# Patient Record
Sex: Female | Born: 1975 | Race: Asian | Hispanic: No | Marital: Single | Smoking: Never smoker
Health system: Southern US, Community
[De-identification: ages and names within clinical notes are randomized; demographics above are authoritative.]

---

## 2003-11-01 ENCOUNTER — Other Ambulatory Visit: Admission: RE | Admit: 2003-11-01 | Discharge: 2003-11-01 | Payer: Self-pay | Admitting: Obstetrics and Gynecology

## 2004-05-25 ENCOUNTER — Inpatient Hospital Stay (HOSPITAL_COMMUNITY): Admission: AD | Admit: 2004-05-25 | Discharge: 2004-05-27 | Payer: Self-pay | Admitting: Obstetrics and Gynecology

## 2004-05-30 ENCOUNTER — Encounter: Admission: RE | Admit: 2004-05-30 | Discharge: 2004-06-29 | Payer: Self-pay | Admitting: Obstetrics and Gynecology

## 2007-04-03 ENCOUNTER — Other Ambulatory Visit: Admission: RE | Admit: 2007-04-03 | Discharge: 2007-04-03 | Payer: Self-pay | Admitting: *Deleted

## 2009-01-08 ENCOUNTER — Inpatient Hospital Stay (HOSPITAL_COMMUNITY): Admission: AD | Admit: 2009-01-08 | Discharge: 2009-01-10 | Payer: Self-pay | Admitting: Obstetrics and Gynecology

## 2011-04-02 LAB — CBC
HCT: 38.4 % (ref 36.0–46.0)
MCHC: 33.4 g/dL (ref 30.0–36.0)
MCV: 82.8 fL (ref 78.0–100.0)
Platelets: 125 10*3/uL — ABNORMAL LOW (ref 150–400)
RBC: 3.91 MIL/uL (ref 3.87–5.11)
RBC: 4.64 MIL/uL (ref 3.87–5.11)
RDW: 16.2 % — ABNORMAL HIGH (ref 11.5–15.5)
WBC: 15.5 10*3/uL — ABNORMAL HIGH (ref 4.0–10.5)

## 2011-04-02 LAB — URINALYSIS, DIPSTICK ONLY
Bilirubin Urine: NEGATIVE
Nitrite: NEGATIVE
Protein, ur: NEGATIVE mg/dL
Specific Gravity, Urine: 1.015 (ref 1.005–1.030)
Urobilinogen, UA: 0.2 mg/dL (ref 0.0–1.0)

## 2011-04-02 LAB — COMPREHENSIVE METABOLIC PANEL
Albumin: 2.7 g/dL — ABNORMAL LOW (ref 3.5–5.2)
Alkaline Phosphatase: 189 U/L — ABNORMAL HIGH (ref 39–117)
BUN: 5 mg/dL — ABNORMAL LOW (ref 6–23)
Chloride: 107 mEq/L (ref 96–112)
Glucose, Bld: 100 mg/dL — ABNORMAL HIGH (ref 70–99)
Potassium: 4.2 mEq/L (ref 3.5–5.1)
Total Bilirubin: 0.2 mg/dL — ABNORMAL LOW (ref 0.3–1.2)

## 2011-04-02 LAB — RPR: RPR Ser Ql: NONREACTIVE

## 2011-05-01 NOTE — Discharge Summary (Signed)
Love, [name]                 ACCOUNT NO.:  [id number]   MEDICAL RECORD NO.:  [id number]          PATIENT TYPE:  INP   LOCATION:  9128                          FACILITY:  [facility]   PHYSICIAN:  Sophia Monday, MD        DATE OF BIRTH:  May 15, 1976   DATE OF ADMISSION:  01/08/2009  DATE OF DISCHARGE:  01/10/2009                               DISCHARGE SUMMARY   ADMITTING DIAGNOSIS:  Intrauterine pregnancy at term and active labor.   DISCHARGE DIAGNOSIS:  Intrauterine pregnancy at term and active labor,  delivered via spontaneous vaginal delivery.   HISTORY OF PRESENT ILLNESS:  A 35 year old G2, P1-0-0-1 at 23 plus weeks  with contractions that have been increasing in intensity and frequency  at approximately 5 a.m.  Good fetal movement, no loss of fluid, no  vaginal bleeding, and uncomplicated prenatal care.   PAST MEDICAL HISTORY:  Nonsignificant.   PAST SURGICAL HISTORY:  Nonsignificant.   PAST OB/GYN HISTORY:  G1 was a term delivery with pregnancy-induced  hypertension, female infant, 5 pounds 9 ounces.  G2 is the present  pregnancy.  No history of any abnormal Pap smear or sexually transmitted  diseases.   MEDICATIONS:  Prenatal vitamins.   ALLERGIES:  No known drug allergies.   SOCIAL HISTORY:  She denies alcohol, tobacco, or drug use and is  married.   FAMILY HISTORY:  Significant for diabetes in her father.   PRENATAL LABS:  Hemoglobin 11.8, platelets 179,000, B positive.  Antibody screen negative.  Gonorrhea negative.  Chlamydia negative.  RPR  nonreactive.  Rubella immune.  Cystic fibrosis screen negative.  Hepatitis B surface antigen negative.  HIV negative.  First trimester  screen within normal limits.  AFP within normal limits.  Glucola of 116.  Group B strep was negative.   Ultrasound performed on August 12, 2008, at 17 plus 6 weeks, was  consistent with dates revealed normal anatomy, anterior placenta, and a  female infant.   PHYSICAL EXAMINATION:  On  admission, she is afebrile with stable vital  signs and a benign exam.   Fetal heart tones were in the 130s and 140s and reactive with  contractions every 3-4 minutes.  Cervical check was 6-7 cm dilated, 90%  effaced, and 0 station.  Her membranes were ruptured for clear fluid.  She was given epidural for pain.  She progressed to complete-complete,  +2 and pushed very well for approximately 45 minutes.  She delivered a  viable female infant at 11:26 a.m. with Apgars of 9 at 1 minute and 9 at  5 minutes and weight of 6 pounds 7 ounces.  Placenta was expressed  intact.  The  laceration was periclitoral and second-degree perineal  repair with 3-0 Vicryl in typical fashion.  Bilateral labial abrasions  were hemostatic.  The patient postpartum course was relatively  uncomplicated.  She remained afebrile with vital signs stable  throughout.  Hemoglobin decreased from 12.5 to 10.8.  She was discharged  to home on postpartum day 2 after discussing routine discharge  instructions and numbers to call with any questions  or problems.  She  was given prescriptions for Motrin, Vicodin, and prenatal vitamins and  will follow up in 6 weeks.  She is B positive, rubella immune, breast-  feeding, hemoglobin decreased from 12.5 to 10.8.  She plans to use an  IUD for birth control.  This will be placed at her postpartum checkup.      Sophia Monday, MD  Electronically Signed     JB/MEDQ  D:  01/10/2009  T:  01/10/2009  Job:  [id number]

## 2011-05-04 NOTE — Discharge Summary (Signed)
[name], [name]                           ACCOUNT NO.:  [id number]   MEDICAL RECORD NO.:  [id number]                   PATIENT TYPE:  INP   LOCATION:  9318                                 [facility]ILITY:  [facility]   PHYSICIAN:  Zenaida Niece, M.D.             DATE OF BIRTH:  1976-03-04   DATE OF ADMISSION:  05/25/2004  DATE OF DISCHARGE:  05/27/2004                                 DISCHARGE SUMMARY   ADMISSION DIAGNOSES:  1. Intrauterine pregnancy at 40 weeks.  2. Gestational hypertension.   DISCHARGE DIAGNOSES:  1. Intrauterine pregnancy at 40 weeks.  2. Gestational hypertension.   PROCEDURE:  On June 9th she had a spontaneous vaginal delivery with  episiotomy and a vaginal laceration.   HISTORY AND PHYSICAL:  This is a 35 year old [place] female gravida 1, para 0  with an EGA of [redacted] weeks by an LMP consistent with a 8-week ultrasound who  presented with the complaint of regular contractions.  On evaluation in  triage, blood pressures were elevated and her cervix was 2 cm, 85%, and  minus 1 per the nurse.  She had no symptoms.  Prenatal care essentially  uncomplicated.   PRENATAL LABORATORY DATA:  Blood type is B positive with a negative antibody  screen, RPR nonreactive, rubella immune, hepatitis B surface antigen  negative, HIV was declined, gonorrhea and Chlamydia negative.  Triple screen  normal.  One-hour Glucola 95.  Group B strep is negative.   PAST HISTORY:  Essentially noncontributory.   PHYSICAL EXAMINATION:  VITAL SIGNS:  Blood pressure 146/100.  The remainder  of her vitals are stable.  ABDOMEN:  Soft with a fundal height of 36 cm.  Fetal heart tracing was  normal with contractions every 4 to 5 minutes.  EXTREMITIES:  Deep tendon reflexes were trace.   HOSPITAL COURSE:  The patient was admitted by Dr. Ambrose Mantle and put on IV  magnesium sulfate.  PIH labs were essentially within normal limits.  She had  a protracted course and was started on Pitocin and had amniotomy  performed.  She then progressed rapidly to complete and pushed for approximately 2  hours.  She had a spontaneous vaginal delivery of a viable female infant  with Apgars of 8 and 9 that weighed 5 pounds 6 ounces.  There were loops of  nuchal cord.  Placenta delivered spontaneous and was intact and the uterus  palpated normal. She had a right vaginal sulcus laceration as well as a left  mediolateral episiotomy repaired with 2-0 Vicryl and estimated blood loss  was 600 mL.  Postpartum she was kept on magnesium sulfate for approximately  24 hours and blood pressures normalized and she had good diuresis.  Predelivery hemoglobin was 13.2.  Postdelivery was 11.3.  PIH labs remained  essentially normal except for a bump in LDH from 147 to 332.  On the morning  of postpartum day #2, blood pressures  were 120s to 130s over 80s and she was  felt to be stable enough for discharge home.   DISCHARGE INSTRUCTIONS:  Regular diet, pelvic rest, follow up in six weeks.  Medications are Percocet, dispense #20, one to two p.o. q.4-6 h. p.r.n. pain  and over-the-counter ibuprofen as needed.  She is given our discharge  pamphlet.                                               Zenaida Niece, M.D.    TDM/MEDQ  D:  05/27/2004  T:  05/27/2004  Job:  [id number]

## 2013-12-03 ENCOUNTER — Other Ambulatory Visit: Payer: Self-pay | Admitting: Family Medicine

## 2013-12-03 ENCOUNTER — Other Ambulatory Visit (HOSPITAL_COMMUNITY)
Admission: RE | Admit: 2013-12-03 | Discharge: 2013-12-03 | Disposition: A | Discharge status: Home / Self Care | Payer: 59 | Source: Ambulatory Visit | Attending: Family Medicine | Admitting: Family Medicine

## 2013-12-03 DIAGNOSIS — Z0000 Encounter for general adult medical examination without abnormal findings: Secondary | ICD-10-CM | POA: Insufficient documentation

## 2016-03-21 ENCOUNTER — Ambulatory Visit: Payer: Managed Care, Other (non HMO) | Attending: Family Medicine

## 2016-03-21 DIAGNOSIS — M6281 Muscle weakness (generalized): Secondary | ICD-10-CM | POA: Insufficient documentation

## 2016-03-21 DIAGNOSIS — M25562 Pain in left knee: Secondary | ICD-10-CM | POA: Insufficient documentation

## 2016-03-21 DIAGNOSIS — M25561 Pain in right knee: Secondary | ICD-10-CM | POA: Insufficient documentation

## 2016-03-21 NOTE — Therapy (Signed)
[facility] [address] [place], [place], [address] Phone: [phone]   Fax:  [phone]  Physical Therapy Evaluation  Patient Details  Name: [name] MRN: [id number] Date of Birth: [birth year] Referring Provider: Eula Listen, MD  Encounter Date: 03/21/2016      PT End of Session - 03/21/16 1226    Visit Number 1   Date for PT Re-Evaluation 05/16/16   PT Start Time 1151  Pt late for appt   PT Stop Time 1226   PT Time Calculation (min) 35 min   Activity Tolerance Patient tolerated treatment well   Behavior During Therapy [facility] for tasks assessed/performed      History reviewed. No pertinent past medical history.  History reviewed. No pertinent past surgical history.  There were no vitals filed for this visit.  Visit Diagnosis:  Pain in left knee - Plan: PT plan of care cert/re-cert  Pain in right knee - Plan: PT plan of care cert/re-cert  Muscle weakness (generalized) - Plan: PT plan of care cert/re-cert      Subjective Assessment - 03/21/16 1156    Subjective Pt presents to PT with complaints of bilateral knee pain that began ~6 months ago.  Pt reports pain in the back of the legs with walking long distances (>30 minutes).     Limitations Sitting;Walking   How long can you sit comfortably? 20 minutes   How long can you walk comfortably? 20-30 minutes- pain limits   Diagnostic tests x-ray performed: no result known   Patient Stated Goals reduce    Currently in Pain? Yes   Pain Score 6    Pain Location Knee   Pain Orientation Right;Left   Pain Descriptors / Indicators Sore   Pain Type Chronic pain   Pain Onset More than a month ago   Pain Frequency Constant   Aggravating Factors  walking > 20 minutes, sitting too long   Pain Relieving Factors relax, stretch            [facility] PT Assessment - 03/21/16 0001    Assessment   Medical Diagnosis Bilateral anterior knee pain   Referring  Provider Eula Listen, MD   Onset Date/Surgical Date 09/21/15   Next MD Visit after therapy   Precautions   Precautions None   Restrictions   Weight Bearing Restrictions No   Balance Screen   Has the patient fallen in the past 6 months No   Has the patient had a decrease in activity level because of a fear of falling?  No   Is the patient reluctant to leave their home because of a fear of falling?  No   Home [employer] residence   Living Arrangements Spouse/significant other;Children   Type of Home House   Home Layout Two level   Prior Function   Level of Independence Independent   Vocation Unemployed   Leisure walk for exercise   Cognition   Overall Cognitive Status Within Functional Limits for tasks assessed   Observation/Other Assessments   Focus on Therapeutic Outcomes (FOTO)  53% limitation   Posture/Postural Control   Posture/Postural Control No significant limitations   ROM / Strength   AROM / PROM / Strength AROM;Strength   AROM   Overall AROM  Within functional limits for tasks performed   Overall AROM Comments Full hip and knee AROM and PROM bilaterally.  Lt patellar clicking with open chain knee extension on Lt.   Strength  Overall Strength Deficits   Overall Strength Comments Bil hip flexion 4/5, all other hip 4+/5, knees 5/5   Palpation   Patella mobility normal patellar mobiltiy.  Click and crepitus on the Lt with patellar mobs   Palpation comment No palpable tenderness today   Ambulation/Gait   Ambulation/Gait Yes   Ambulation/Gait Assistance 7: Independent   Ambulation Distance (Feet) 100 Feet   Gait Pattern Within Functional Limits   Ambulation Surface Level;Other (comment)  normal gait on steps                           PT Education - 03/21/16 1219    Education provided Yes   Education Details HEP: long arc quads, SLR, hamstring stretch   Person(s) Educated Patient   Methods  Explanation;Demonstration;Handout   Comprehension Verbalized understanding;Returned demonstration          PT Short Term Goals - 03/21/16 1202    PT SHORT TERM GOAL #1   Title be independent in initial HEP   Time 4   Period Weeks   Status New   PT SHORT TERM GOAL #2   Title walk for exercise for 30 minutes without limitation due to knee pain   Time 4   Period Weeks   Status New           PT Long Term Goals - 03/21/16 1201    PT LONG TERM GOAL #1   Title be independent in advanced HEP   Time 8   Period Weeks   Status New   PT LONG TERM GOAL #2   Title reduce FOTO to < or = to 39% limitation   Time 8   Period Weeks   Status New   PT LONG TERM GOAL #3   Title walk for exercise for 45 minutes without limitation due to knee pain   Time 8   Period Weeks   Status New   PT LONG TERM GOAL #4   Title reduce knee pain to < or  = to 3/10 with standing and walking tasks   Time 8   Period Weeks   Status New   PT LONG TERM GOAL #5   Title reduce knee pain to < or = to 2/10 knee pain with standing and walking   Time 8   Period Weeks   Status New               Plan - 03/21/16 1222    Clinical Impression Statement Pt reports to PT with 6 month history of bilateral knee pain without cause.  She has had to limit her walking for exercise due to pain. Pt rates the pain as 5-6/10 with walking.  Pt demonstrates weak hip flexors bilaterally, crepitus and clicking with Lt patellar mobs and tightness in bil hamstrings. FOTO score is 53% limitation.  Pt will benefit from skilled PT    Pt will benefit from skilled therapeutic intervention in order to improve on the following deficits Pain;Decreased strength;Decreased activity tolerance;Difficulty walking   Rehab Potential Good   PT Frequency 2x / week   PT Duration 8 weeks   PT Treatment/Interventions ADLs/Self Care Home Management;Cryotherapy;Electrical Stimulation;Moist Heat;Therapeutic exercise;Therapeutic  activities;Functional mobility training;Stair training;Ultrasound;Neuromuscular re-education;Patient/family education;Manual techniques;Taping;Dry needling;Passive range of motion   PT Next Visit Plan Bil knee/hip strength and flexibility   Consulted and Agree with Plan of Care Patient         Problem List There are no active problems  to display for this patient.  Lorrene Reid, PT 03/21/2016 12:28 PM   [facility] [address], [address] [place], [place], [address] Phone: [phone]   Fax:  [phone]  Name: WILSIE KERN MRN: [id number] Date of Birth: January 14, 1976

## 2016-03-21 NOTE — Patient Instructions (Signed)
   Hip Flexion / Knee Extension: Straight-Leg Raise (Eccentric)   Lie on back. Lift leg with knee straight. Slowly lower leg for 3-5 seconds. ___10 reps per set, 20__ sets per day, _7__ days per week. Lower like elevator, stopping at each floor. Heel Slides   HIP: Hamstrings - Short Sitting    Rest leg on raised surface. Keep knee straight. Lift chest. Hold _20__ seconds. _10__ reps per set, _3__ sets per day  Copyright  VHI. All rights reserved.  KNEE: Extension, Long Arc Quads - Sitting    Raise leg until knee is straight.  Hold 5 seconds _10__ reps per set, _3__ sets per day  Copyright  VHI. All rights reserved.  [facility] [address], [address] [place], [place] [address] Phone # [phone] Fax [phone]

## 2016-03-29 ENCOUNTER — Ambulatory Visit: Payer: Managed Care, Other (non HMO) | Admitting: Physical Therapy

## 2016-03-29 ENCOUNTER — Encounter: Payer: Self-pay | Admitting: Physical Therapy

## 2016-03-29 DIAGNOSIS — M25561 Pain in right knee: Secondary | ICD-10-CM

## 2016-03-29 DIAGNOSIS — M25562 Pain in left knee: Secondary | ICD-10-CM

## 2016-03-29 DIAGNOSIS — M6281 Muscle weakness (generalized): Secondary | ICD-10-CM

## 2016-03-29 NOTE — Therapy (Signed)
[facility] [address] [place], [place], [address] Phone: [phone]   Fax:  [phone]  Physical Therapy Treatment  Patient Details  Name: [name] MRN: [id number] Date of Birth: Apr 04, 1976 Referring Provider: Eula Listen, MD  Encounter Date: 03/29/2016      PT End of Session - 03/29/16 1544    Visit Number 2   Date for PT Re-Evaluation 05/16/16   PT Start Time 1530   PT Stop Time 1615   PT Time Calculation (min) 45 min   Activity Tolerance Patient tolerated treatment well   Behavior During Therapy [facility] for tasks assessed/performed      History reviewed. No pertinent past medical history.  History reviewed. No pertinent past surgical history.  There were no vitals filed for this visit.      Subjective Assessment - 03/29/16 1540    Subjective Pt with complain of pain in Left knee rated as 5/10 after walk. No complain of pain with rest, pt is fearful of some of her exercises.    Limitations Sitting;Walking   How long can you sit comfortably? 20 minutes   How long can you walk comfortably? 20-30 minutes- pain limits   Diagnostic tests x-ray performed: no result known   Patient Stated Goals reduce    Currently in Pain? Yes   Pain Score 5    Pain Location Knee   Pain Orientation Left   Pain Descriptors / Indicators Sore   Pain Type Chronic pain   Pain Onset More than a month ago   Pain Frequency Constant   Aggravating Factors  walking , stiiting too long, bending knee   Pain Relieving Factors relax, stretch   Multiple Pain Sites No                         [facility] Adult PT Treatment/Exercise - 03/29/16 0001    Exercises   Exercises Knee/Hip   Knee/Hip Exercises: Stretches   Active Hamstring Stretch Left;3 reps  on stairs   Knee/Hip Exercises: Aerobic   Stationary Bike Bike L1 x   Elliptical --   Knee/Hip Exercises: Supine   Quad Sets  Strengthening;Left;2 sets;10 reps   Straight Leg Raises Strengthening;Left  in all 4 planes x 10, abd challenging due to weakness                PT Education - 03/29/16 1604    Education provided Yes   Education Details SLR in all directions   Person(s) Educated Patient   Methods Explanation;Demonstration;Handout   Comprehension Verbalized understanding;Returned demonstration          PT Short Term Goals - 03/29/16 1553    PT SHORT TERM GOAL #1   Title be independent in initial HEP   Time 4   Period Weeks   Status On-going   PT SHORT TERM GOAL #2   Title walk for exercise for 30 minutes without limitation due to knee pain   Time 4   Period Weeks   Status On-going           PT Long Term Goals - 03/21/16 1201    PT LONG TERM GOAL #1   Title be independent in advanced HEP   Time 8   Period Weeks   Status New   PT LONG TERM GOAL #2   Title reduce FOTO to < or = to 39% limitation   Time 8   Period Weeks  Status New   PT LONG TERM GOAL #3   Title walk for exercise for 45 minutes without limitation due to knee pain   Time 8   Period Weeks   Status New   PT LONG TERM GOAL #4   Title reduce knee pain to < or  = to 3/10 with standing and walking tasks   Time 8   Period Weeks   Status New   PT LONG TERM GOAL #5   Title reduce knee pain to < or = to 2/10 knee pain with standing and walking   Time 8   Period Weeks   Status New               Plan - 03/29/16 1546    Clinical Impression Statement Pt with tight hamstrings and pain with walking longer than 20min. Pt reports per x-ray Lt knee joint is smaller. Pt will benefit from skilled PT for flexibility and strength and help reduce pain in bil knees   Rehab Potential Good   PT Frequency 2x / week   PT Duration 8 weeks   PT Treatment/Interventions ADLs/Self Care Home Management;Cryotherapy;Electrical Stimulation;Moist Heat;Therapeutic exercise;Therapeutic activities;Functional mobility  training;Stair training;Ultrasound;Neuromuscular re-education;Patient/family education;Manual techniques;Taping;Dry needling;Passive range of motion   PT Next Visit Plan Bil knee/hip strength and flexibility   Consulted and Agree with Plan of Care Patient      Patient will benefit from skilled therapeutic intervention in order to improve the following deficits and impairments:  Pain, Decreased strength, Decreased activity tolerance, Difficulty walking  Visit Diagnosis: Pain in left knee  Pain in right knee  Muscle weakness (generalized)     Problem List There are no active problems to display for this patient.   NAUMANN-HOUEGNIFIO,Moosa Bueche PTA 03/29/2016, 5:19 PM  [facility] [address], [address] [place], [place], [address] Phone: [phone]   Fax:  [phone]  Name: [name] MRN: [id number] Date of Birth: 08/04/1976

## 2016-03-29 NOTE — Patient Instructions (Signed)
Hip Flexion / Knee Extension: Straight-Leg Raise (Eccentric)          Repeat each exercise x 10 on each side,  Do 2-3 sets,  Try to perform 2 x a day    Lie on back. Lift leg with knee straight. Slowly lower leg for 3-5 seconds. ___ reps per set, ___ sets per day, ___ days per week. Lower like elevator, stopping at each floor. Add ___ lbs when you achieve ___ repetitions. Rest on elbows. Rest on straight arms.  ABDUCTION: Side-Lying (Active)   Lie on left side, top leg straight. Raise top leg as far as possible. Use ___ lbs. Complete ___ sets of ___ repetitions. Perform ___ sessions per day.  http://gtsc.exer.us/94   (Home) Extension: Hip   With support under abdomen, tighten stomach. Lift right leg in line with body. Do not hyperextend. Alternate legs. Repeat ____ times per set. Do ____ sets per session. Do ____ sessions per week.  ADDUCTION: Side-Lying (Active)   Lie on right side, with top leg bent and in front of other leg. Lift straight leg up as high as possible. Use ___ lbs. Complete ___ sets of ___ repetitions. Perform ___ sessions per day.  http://gtsc.exer.us/129   Copyright  VHI. All rights reserved.

## 2016-04-02 ENCOUNTER — Ambulatory Visit: Payer: Managed Care, Other (non HMO)

## 2016-04-02 DIAGNOSIS — M25561 Pain in right knee: Secondary | ICD-10-CM

## 2016-04-02 DIAGNOSIS — M25562 Pain in left knee: Secondary | ICD-10-CM

## 2016-04-02 DIAGNOSIS — M6281 Muscle weakness (generalized): Secondary | ICD-10-CM

## 2016-04-02 NOTE — Therapy (Signed)
[facility] [address], [address] [place], [place], [address] Phone: [phone]   Fax:  [phone]  Physical Therapy Treatment  Patient Details  Name: [name] MRN: [id number] Date of Birth: 10/31/1976 Referring Provider: Eula ListenMcKinley, Dominic, MD  Encounter Date: 04/02/2016      PT End of Session - 04/02/16 1304    Visit Number 3   Date for PT Re-Evaluation 05/16/16   PT Start Time 1225   PT Stop Time 1307   PT Time Calculation (min) 42 min   Activity Tolerance Patient tolerated treatment well   Behavior During Therapy [facility] for tasks assessed/performed      History reviewed. No pertinent past medical history.  History reviewed. No pertinent past surgical history.  There were no vitals filed for this visit.      Subjective Assessment - 04/02/16 1237    Subjective My Lt knee is feeling better.  Pain is same but the sensation of weakness is better.     Currently in Pain? Yes   Pain Score 5    Pain Location Knee   Pain Orientation Left   Pain Descriptors / Indicators Sore   Pain Type Chronic pain   Pain Onset More than a month ago   Pain Frequency Constant   Aggravating Factors  walking too long, sitting too long, bending the knee   Pain Relieving Factors stretching, rest                         [facility] Adult PT Treatment/Exercise - 04/02/16 0001    Knee/Hip Exercises: Stretches   Active Hamstring Stretch Left;3 reps  on stairs   Knee/Hip Exercises: Aerobic   Stationary Bike Bike L1 x 8min   Knee/Hip Exercises: Standing   Hip Abduction Stengthening;Both;2 sets;10 reps   Hip Extension Stengthening;Both;2 sets;10 reps   Forward Step Up Both;2 sets;10 reps;Hand Hold: 1;Step Height: 6"   Rocker Board 3 minutes   Walking with Sports Cord 20# forward and reverse x 10 each   Knee/Hip Exercises: Supine   Bridges with Harley-DavidsonBall Squeeze Strengthening;Both;2 sets;10 reps   Straight Leg Raises  Strengthening;Left  in all 4 planes x 10, abd challenging due to weakness                  PT Short Term Goals - 04/02/16 1238    PT SHORT TERM GOAL #1   Title be independent in initial HEP   Status Achieved   PT SHORT TERM GOAL #2   Title walk for exercise for 30 minutes without limitation due to knee pain   Time 4   Period Weeks   Status On-going           PT Long Term Goals - 03/21/16 1201    PT LONG TERM GOAL #1   Title be independent in advanced HEP   Time 8   Period Weeks   Status New   PT LONG TERM GOAL #2   Title reduce FOTO to < or = to 39% limitation   Time 8   Period Weeks   Status New   PT LONG TERM GOAL #3   Title walk for exercise for 45 minutes without limitation due to knee pain   Time 8   Period Weeks   Status New   PT LONG TERM GOAL #4   Title reduce knee pain to < or  = to 3/10 with standing and walking tasks  Time 8   Period Weeks   Status New   PT LONG TERM GOAL #5   Title reduce knee pain to < or = to 2/10 knee pain with standing and walking   Time 8   Period Weeks   Status New               Plan - 04/02/16 1240    Clinical Impression Statement Pt reports that she has pain after walking longer distances.  No pain with walking, just pain after. Pt reports that her knees feel more stable and no change in pain.  Pt is able to tolerate all exercise for strength without difficulty.  Pt remains weak in the hips and knees.  Pt will continue to benefit from skilled PT for strength and endurance of bil. hips and knees.     Rehab Potential Good   PT Frequency 2x / week   PT Duration 8 weeks   PT Treatment/Interventions ADLs/Self Care Home Management;Cryotherapy;Electrical Stimulation;Moist Heat;Therapeutic exercise;Therapeutic activities;Functional mobility training;Stair training;Ultrasound;Neuromuscular re-education;Patient/family education;Manual techniques;Taping;Dry needling;Passive range of motion   PT Next Visit Plan Bil  knee/hip strength and flexibility   Consulted and Agree with Plan of Care Patient      Patient will benefit from skilled therapeutic intervention in order to improve the following deficits and impairments:  Pain, Decreased strength, Decreased activity tolerance, Difficulty walking  Visit Diagnosis: Pain in left knee  Pain in right knee  Muscle weakness (generalized)     Problem List There are no active problems to display for this patient.  [name], PT 04/02/2016 1:05 PM  [facility] [address] [place], [place], [address] Phone: [phone]   Fax:  [phone]  Name: [name] MRN: [id number] Date of Birth: [birth year]

## 2016-04-04 ENCOUNTER — Ambulatory Visit: Payer: Managed Care, Other (non HMO)

## 2016-04-04 DIAGNOSIS — M6281 Muscle weakness (generalized): Secondary | ICD-10-CM

## 2016-04-04 DIAGNOSIS — M25561 Pain in right knee: Secondary | ICD-10-CM

## 2016-04-04 DIAGNOSIS — M25562 Pain in left knee: Secondary | ICD-10-CM

## 2016-04-04 NOTE — Therapy (Signed)
[facility] [address], [address] [place], [place], [address] Phone: [phone]   Fax:  [phone]  Physical Therapy Treatment  Patient Details  Name: [name] MRN: [id number] Date of Birth: 1976-06-18 Referring Provider: Eula Listen, MD  Encounter Date: 04/04/2016      PT End of Session - 04/04/16 1058    Visit Number 4   Date for PT Re-Evaluation 05/16/16   PT Start Time 1016   PT Stop Time 1058   PT Time Calculation (min) 42 min   Activity Tolerance Patient tolerated treatment well   Behavior During Therapy [facility] for tasks assessed/performed      History reviewed. No pertinent past medical history.  History reviewed. No pertinent past surgical history.  There were no vitals filed for this visit.      Subjective Assessment - 04/04/16 1026    Subjective Was able to walk for 25 minutes yesterday.  Mild soreness in Rt knee after walking.  Pt has returned to doing standing hip abduction and extension exercises at home.  No pain with this exercise.   Currently in Pain? No/denies                         [facility] Adult PT Treatment/Exercise - 04/04/16 0001    Knee/Hip Exercises: Stretches   Active Hamstring Stretch Left;3 reps  on stairs   Knee/Hip Exercises: Aerobic   Stationary Bike Bike L2 x   Knee/Hip Exercises: Machines for Strengthening   Total Gym Leg Press 50# 3x10 bil  seat 4   Knee/Hip Exercises: Standing   Lateral Step Up Both;2 sets;10 reps;Hand Hold: 0;Step Height: 6"   Rocker Board 3 minutes   Rebounder weight shifting 3 ways x 1 minute each   Walking with Sports Cord 20# forward and reverse x 10 each   Other Standing Knee Exercises mini squats 2x10                  PT Short Term Goals - 04/02/16 1238    PT SHORT TERM GOAL #1   Title be independent in initial HEP   Status Achieved   PT SHORT TERM GOAL #2   Title walk for exercise for 30 minutes without  limitation due to knee pain   Time 4   Period Weeks   Status On-going           PT Long Term Goals - 03/21/16 1201    PT LONG TERM GOAL #1   Title be independent in advanced HEP   Time 8   Period Weeks   Status New   PT LONG TERM GOAL #2   Title reduce FOTO to < or = to 39% limitation   Time 8   Period Weeks   Status New   PT LONG TERM GOAL #3   Title walk for exercise for 45 minutes without limitation due to knee pain   Time 8   Period Weeks   Status New   PT LONG TERM GOAL #4   Title reduce knee pain to < or  = to 3/10 with standing and walking tasks   Time 8   Period Weeks   Status New   PT LONG TERM GOAL #5   Title reduce knee pain to < or = to 2/10 knee pain with standing and walking   Time 8   Period Weeks   Status New  Plan - 04/04/16 1028    Clinical Impression Statement Pt without any pain today.  Pt walked 25 minutes yesterday with Rt LE soreness after walking.  Pt remains weakn in hips and knees bil. and is tolerating advancement of strength in the clinic including level 2 on the bike and leg press today.  Pt will continue to benefit from skilled PT for advancement of strength for bil. knees.     Rehab Potential Good   PT Frequency 2x / week   PT Duration 8 weeks   PT Treatment/Interventions ADLs/Self Care Home Management;Cryotherapy;Electrical Stimulation;Moist Heat;Therapeutic exercise;Therapeutic activities;Functional mobility training;Stair training;Ultrasound;Neuromuscular re-education;Patient/family education;Manual techniques;Taping;Dry needling;Passive range of motion   PT Next Visit Plan Bil knee/hip strength and flexibility   Consulted and Agree with Plan of Care Patient      Patient will benefit from skilled therapeutic intervention in order to improve the following deficits and impairments:  Pain, Decreased strength, Decreased activity tolerance, Difficulty walking  Visit Diagnosis: Pain in right knee  Pain in left  knee  Muscle weakness (generalized)     Problem List There are no active problems to display for this patient.  Lorrene ReidKelly Takacs, PT 04/04/2016 11:04 AM  [facility] [address], [address] [place], [place], [address] Phone: [phone]   Fax:  [phone]  Name: Debbora LacrosseSri R Wafer MRN: [id number] Date of Birth: 01/13/1976

## 2016-04-09 ENCOUNTER — Ambulatory Visit: Payer: Managed Care, Other (non HMO) | Admitting: Physical Therapy

## 2016-04-09 ENCOUNTER — Encounter: Payer: Self-pay | Admitting: Physical Therapy

## 2016-04-09 DIAGNOSIS — M25562 Pain in left knee: Secondary | ICD-10-CM

## 2016-04-09 DIAGNOSIS — M25561 Pain in right knee: Secondary | ICD-10-CM

## 2016-04-09 DIAGNOSIS — M6281 Muscle weakness (generalized): Secondary | ICD-10-CM

## 2016-04-09 NOTE — Therapy (Signed)
[facility] [address], [address] [place], [place], [address] Phone: [phone]   Fax:  [phone]  Physical Therapy Treatment  Patient Details  Name: Debbora LacrosseSri R Zeis MRN: [id number] Date of Birth: 06/01/1976 Referring Provider: Eula ListenMcKinley, Dominic, MD  Encounter Date: 04/09/2016      PT End of Session - 04/09/16 1152    Visit Number 5   Date for PT Re-Evaluation 05/16/16   PT Start Time 1145   PT Stop Time 1230   PT Time Calculation (min) 45 min   Activity Tolerance Patient tolerated treatment well   Behavior During Therapy [facility] for tasks assessed/performed      History reviewed. No pertinent past medical history.  History reviewed. No pertinent past surgical history.  There were no vitals filed for this visit.      Subjective Assessment - 04/09/16 1149    Subjective Pt reports knee' are feeling better. Pt reports initially had a lot of tightness in hamstrings area.     Limitations Sitting;Walking   How long can you sit comfortably? 20 minutes   How long can you walk comfortably? 20-30 minutes- pain limits   Diagnostic tests x-ray performed: no result known   Patient Stated Goals reduce    Currently in Pain? No/denies   Multiple Pain Sites No                         [facility] Adult PT Treatment/Exercise - 04/09/16 0001    Posture/Postural Control   Posture/Postural Control No significant limitations   Exercises   Exercises Knee/Hip   Knee/Hip Exercises: Stretches   Active Hamstring Stretch Left;3 reps  on stairs   Knee/Hip Exercises: Aerobic   Stationary Bike Bike L2 x 8min   Knee/Hip Exercises: Machines for Strengthening   Total Gym Leg Press 60# 3x10 bil  seat # 4   Knee/Hip Exercises: Standing   Lateral Step Up Both;2 sets;10 reps;Hand Hold: 0;Step Height: 6"   Rocker Board 3 minutes   Rebounder weight shifting 3 ways x 1 minute each   Walking with Sports Cord 25# forward/reverse,  backward/reverse  x 10 each   Other Standing Knee Exercises squats 2x10  needs v/c's to go deeper                  PT Short Term Goals - 04/09/16 1154    PT SHORT TERM GOAL #1   Title be independent in initial HEP   Time 4   Period Weeks   Status Achieved   PT SHORT TERM GOAL #2   Title walk for exercise for 30 minutes without limitation due to knee pain   Time 4   Period Weeks   Status On-going           PT Long Term Goals - 04/09/16 1223    PT LONG TERM GOAL #1   Title be independent in advanced HEP   Time 8   Period Weeks   Status On-going   PT LONG TERM GOAL #2   Title reduce FOTO to < or = to 39% limitation   Time 8   Period Weeks   Status On-going   PT LONG TERM GOAL #3   Title walk for exercise for 45 minutes without limitation due to knee pain   Time 8   Period Weeks   Status On-going   PT LONG TERM GOAL #4   Title reduce knee pain to < or  =  to 3/10 with standing and walking tasks   Time 8   Period Weeks   Status On-going   PT LONG TERM GOAL #5   Title reduce knee pain to < or = to 2/10 knee pain with standing and walking   Time 8   Period Weeks   Status On-going               Plan - 04/09/16 1152    Clinical Impression Statement Pt continues to present with weak hips and knees and tight hamstrings, gastrocnemius and soleus. Pt will continue to benefit from skilled PT for strength bil knees and hips.    Rehab Potential Good   PT Frequency 2x / week   PT Duration 8 weeks   PT Treatment/Interventions ADLs/Self Care Home Management;Cryotherapy;Electrical Stimulation;Moist Heat;Therapeutic exercise;Therapeutic activities;Functional mobility training;Stair training;Ultrasound;Neuromuscular re-education;Patient/family education;Manual techniques;Taping;Dry needling;Passive range of motion   PT Next Visit Plan Bil knee/hip strength and flexibility   Consulted and Agree with Plan of Care Patient      Patient will benefit from skilled  therapeutic intervention in order to improve the following deficits and impairments:  Pain, Decreased strength, Decreased activity tolerance, Difficulty walking  Visit Diagnosis: Pain in right knee  Pain in left knee  Muscle weakness (generalized)     Problem List There are no active problems to display for this patient.   NAUMANN-HOUEGNIFIO,Malvin Morrish PTA 04/09/2016, 12:23 PM  [facility] [address], [address] [place], [place], [address] Phone: [phone]   Fax:  [phone]  Name: LARKYN GREENBERGER MRN: [id number] Date of Birth: [birth year]

## 2016-04-11 ENCOUNTER — Encounter: Payer: Self-pay | Admitting: Physical Therapy

## 2016-04-11 ENCOUNTER — Ambulatory Visit: Payer: Managed Care, Other (non HMO) | Admitting: Physical Therapy

## 2016-04-11 DIAGNOSIS — M25562 Pain in left knee: Secondary | ICD-10-CM

## 2016-04-11 DIAGNOSIS — M6281 Muscle weakness (generalized): Secondary | ICD-10-CM

## 2016-04-11 DIAGNOSIS — M25561 Pain in right knee: Secondary | ICD-10-CM

## 2016-04-11 NOTE — Patient Instructions (Signed)
  Copyright  VHI. All rights reserved.  Soleus Stretch    Stand with right foot back, both knees bent. Keeping heel on floor, turned slightly out, lean into wall until stretch is felt in lower calf. Hold 20 seconds. Repeat  3  times per set. Do 1 sets per session. Do 1-2 sessions per day.  http://orth.exer.us/24   Copyright  VHI. All rights reserved.  [facility] [address], [address] [place], [place] [address] Phone # [phone] Fax [phone]

## 2016-04-11 NOTE — Therapy (Signed)
[facility] [address], [address] [place], [place], [address] Phone: [phone]   Fax:  [phone]  Physical Therapy Treatment  Patient Details  Name: Sophia LacrosseSri R Love MRN: [id number] Date of Birth: 04/11/1976 Referring Provider: Eula ListenMcKinley, Dominic, MD  Encounter Date: 04/11/2016      PT End of Session - 04/11/16 1234    Visit Number 6   Date for PT Re-Evaluation 05/16/16   PT Start Time 1230   PT Stop Time 1314   PT Time Calculation (min) 44 min   Activity Tolerance Patient tolerated treatment well   Behavior During Therapy [facility] for tasks assessed/performed      History reviewed. No pertinent past medical history.  History reviewed. No pertinent past surgical history.  There were no vitals filed for this visit.      Subjective Assessment - 04/11/16 1232    Subjective Pt reports left knee feels much better, but Rt knee in posterior knee / distal hamstrings still painful   Limitations Sitting;Walking   How long can you sit comfortably? 20 minutes   How long can you walk comfortably? 20-30 minutes- pain limits   Diagnostic tests x-ray performed: no result known   Patient Stated Goals reduce    Currently in Pain? No/denies                         [facility] - 04/11/16 0001    Posture/Postural Control   Posture/Postural Control No significant limitations   Exercises   Exercises Knee/Hip   Knee/Hip Exercises: Stretches   Active Hamstring Stretch Left;2 reps  on stairs   Knee/Hip Exercises: Aerobic   Stationary Bike Bike L2 x 8min   Knee/Hip Exercises: Machines for Strengthening   Total Gym Leg Press 65# 3x10 bil, seat # 4   Knee/Hip Exercises: Standing   Lateral Step Up Both;2 sets;10 reps;Hand Hold: 0;Step Height: 6"  with opposite leg into abduction   Rocker Board 3 minutes   Rebounder --   Walking with Sports Cord 25# forward/reverse, backward/reverse  x 10 each   Other Standing Knee Exercises --   Other Standing Knee Exercises Prostretch x 3 20sec   Manual Therapy   Manual Therapy Soft tissue mobilization   Soft tissue mobilization to Rt distal med hamstrings, with TP release in prone and supine                  PT Short Term Goals - 04/09/16 1154    PT SHORT TERM GOAL #1   Title be independent in initial HEP   Time 4   Period Weeks   Status Achieved   PT SHORT TERM GOAL #2   Title walk for exercise for 30 minutes without limitation due to knee pain   Time 4   Period Weeks   Status On-going           PT Long Term Goals - 04/09/16 1223    PT LONG TERM GOAL #1   Title be independent in advanced HEP   Time 8   Period Weeks   Status On-going   PT LONG TERM GOAL #2   Title reduce FOTO to < or = to 39% limitation   Time 8   Period Weeks   Status On-going   PT LONG TERM GOAL #3   Title walk for exercise for 45 minutes without limitation due to knee pain   Time 8   Period Weeks  Status On-going   PT LONG TERM GOAL #4   Title reduce knee pain to < or  = to 3/10 with standing and walking tasks   Time 8   Period Weeks   Status On-going   PT LONG TERM GOAL #5   Title reduce knee pain to < or = to 2/10 knee pain with standing and walking   Time 8   Period Weeks   Status On-going               Plan - 04/11/16 1234    Clinical Impression Statement Pt presents with weak hip and knees and tight hamstrings, gastrocnemius and soleus. pt will continue to benefit from skilled PT for strength bil knees and hips and stretching hamstrings and gastrocnemius     Rehab Potential Good   PT Frequency 2x / week   PT Duration 8 weeks   PT Treatment/Interventions ADLs/Self Care Home Management;Cryotherapy;Electrical Stimulation;Moist Heat;Therapeutic exercise;Therapeutic activities;Functional mobility training;Stair training;Ultrasound;Neuromuscular re-education;Patient/family education;Manual techniques;Taping;Dry  needling;Passive range of motion   PT Next Visit Plan Bil knee/hip strength and flexibility   Consulted and Agree with Plan of Care Patient      Patient will benefit from skilled therapeutic intervention in order to improve the following deficits and impairments:  Pain, Decreased strength, Decreased activity tolerance, Difficulty walking  Visit Diagnosis: Pain in right knee  Pain in left knee  Muscle weakness (generalized)     Problem List There are no active problems to display for this patient.   NAUMANN-HOUEGNIFIO,Amilio Zehnder PTA 04/11/2016, 1:11 PM  [facility] [address] [place], [place], [address] Phone: [phone]   Fax:  [phone]  Name: [name] MRN: [id number] Date of Birth: 12/19/1975

## 2016-04-16 ENCOUNTER — Ambulatory Visit: Payer: Managed Care, Other (non HMO) | Attending: Family Medicine

## 2016-04-16 DIAGNOSIS — M25561 Pain in right knee: Secondary | ICD-10-CM | POA: Diagnosis not present

## 2016-04-16 DIAGNOSIS — M25562 Pain in left knee: Secondary | ICD-10-CM | POA: Diagnosis present

## 2016-04-16 DIAGNOSIS — M6281 Muscle weakness (generalized): Secondary | ICD-10-CM | POA: Diagnosis present

## 2016-04-16 NOTE — Therapy (Signed)
[facility] [address], [address] [place], [place], [address] Phone: [phone]   Fax:  [phone]  Physical Therapy Treatment  Patient Details  Name: [name] MRN: [id number] Date of Birth: 1976/09/14 Referring Provider: Eula Listen, MD  Encounter Date: 04/16/2016      PT End of Session - 04/16/16 1307    Visit Number 7   Date for PT Re-Evaluation 05/16/16   PT Start Time 1226   PT Stop Time 1308   PT Time Calculation (min) 42 min   Activity Tolerance Patient tolerated treatment well   Behavior During Therapy [facility] for tasks assessed/performed      History reviewed. No pertinent past medical history.  History reviewed. No pertinent past surgical history.  There were no vitals filed for this visit.      Subjective Assessment - 04/16/16 1255    Subjective Pt reports 50% overall improvement in the frequency of symptoms in the knees.  Legs feel stronger.   Currently in Pain? No/denies   Pain Score 0-No pain  up to 4/10 with getting up from low position   Pain Location Knee   Pain Orientation Left   Pain Descriptors / Indicators Sore   Pain Type Chronic pain   Pain Onset More than a month ago   Pain Frequency Constant   Aggravating Factors  walking too long, sitting too long, getting up from the floor/tub   Pain Relieving Factors stretching, rest                         [facility] Adult PT Treatment/Exercise - 04/16/16 0001    Knee/Hip Exercises: Stretches   Active Hamstring Stretch Left;2 reps  on stairs   Knee/Hip Exercises: Aerobic   Stationary Bike Bike L2 x    Knee/Hip Exercises: Machines for Strengthening   Total Gym Leg Press 65# 3x10 bil, 30# single leg 3x10 each seat # 4   Knee/Hip Exercises: Standing   Forward Step Up Both;2 sets;10 reps;Hand Hold: 1;Step Height: 8"   Rocker Board 3 minutes   Rebounder weight shifting 3 ways x 1 minute each   Walking with Sports Cord 25#  4 waysx 10 each                  PT Short Term Goals - 04/16/16 1300    PT SHORT TERM GOAL #2   Title walk for exercise for 30 minutes without limitation due to knee pain   Status Achieved           PT Long Term Goals - 04/16/16 1301    PT LONG TERM GOAL #1   Title be independent in advanced HEP   Time 8   Period Weeks   Status On-going   PT LONG TERM GOAL #2   Title reduce FOTO to < or = to 39% limitation   Time 8   Period Weeks   Status On-going   PT LONG TERM GOAL #3   Title walk for exercise for 45 minutes without limitation due to knee pain   Time 8   Period Weeks   Status On-going   PT LONG TERM GOAL #4   Title reduce knee pain to < or  = to 3/10 with standing and walking tasks   Time 8   Period Weeks   Status On-going  up to 4/10   PT LONG TERM GOAL #5   Title reduce knee pain to <  or = to 2/10 knee pain with standing and walking   Time 8   Period Weeks   Status On-going               Plan - 04/16/16 1302    Clinical Impression Statement Pt reports 50% reduction in frequency of knee pain overall.  No pain today.  Pain increases to 4/10 when getting up out of the tub.  Pt is able to walk for 30 minutes for exercise without limitation.  Pt able to tolerate single leg on the leg press and 8 inch step with step-ups.  Pt will continue to benefit from skilled PT for strength and flexibility progression of the LEs.   Rehab Potential Good   PT Frequency 2x / week   PT Duration 8 weeks   PT Treatment/Interventions ADLs/Self Care Home Management;Cryotherapy;Electrical Stimulation;Moist Heat;Therapeutic exercise;Therapeutic activities;Functional mobility training;Stair training;Ultrasound;Neuromuscular re-education;Patient/family education;Manual techniques;Taping;Dry needling;Passive range of motion   PT Next Visit Plan Bil knee/hip strength and flexibility   Consulted and Agree with Plan of Care Patient      Patient will benefit from skilled  therapeutic intervention in order to improve the following deficits and impairments:  Pain, Decreased strength, Decreased activity tolerance, Difficulty walking  Visit Diagnosis: Pain in right knee  Pain in left knee  Muscle weakness (generalized)     Problem List There are no active problems to display for this patient.   Lorrene ReidKelly Takacs, PT 04/16/2016 1:11 PM  [facility] [address], [address] [place], [place], [address] Phone: [phone]   Fax:  [phone]  Name: [name] MRN: [id number] Date of Birth: 11/26/1976

## 2016-04-23 ENCOUNTER — Ambulatory Visit: Payer: Managed Care, Other (non HMO) | Admitting: Physical Therapy

## 2016-04-23 ENCOUNTER — Encounter: Payer: Self-pay | Admitting: Physical Therapy

## 2016-04-23 DIAGNOSIS — M25561 Pain in right knee: Secondary | ICD-10-CM | POA: Diagnosis not present

## 2016-04-23 DIAGNOSIS — M6281 Muscle weakness (generalized): Secondary | ICD-10-CM

## 2016-04-23 DIAGNOSIS — M25562 Pain in left knee: Secondary | ICD-10-CM

## 2016-04-23 NOTE — Therapy (Addendum)
[facility] [address], [place], [place], [address] Phone: [phone]   Fax:  [phone]  Physical Therapy Treatment  Patient Details  Name: [name] MRN: [id number] Date of Birth: [birth year] Referring Provider: Rhina Brackett, MD  Encounter Date: 04/23/2016      PT End of Session - 04/23/16 1152    Visit Number 8   Date for PT Re-Evaluation 05/16/16   PT Start Time 3276   PT Stop Time 1225   PT Time Calculation (min) 40 min   Activity Tolerance Patient tolerated treatment well   Behavior During Therapy [facility] for tasks assessed/performed      History reviewed. No pertinent past medical history.  History reviewed. No pertinent past surgical history.  There were no vitals filed for this visit.      Subjective Assessment - 04/23/16 1154    Subjective Last 2 days has had more pain, unsure why. Reports more posterior knee pain.   Currently in Pain? Yes   Pain Score 6    Pain Location Knee   Pain Orientation Right;Left;Posterior   Pain Descriptors / Indicators Sore   Aggravating Factors  Not sure   Pain Relieving Factors Stretching   Multiple Pain Sites No                         [facility] Adult PT Treatment/Exercise - 04/23/16 0001    Knee/Hip Exercises: Stretches   Active Hamstring Stretch Both;2 reps;30 seconds   Knee/Hip Exercises: Aerobic   Stationary Bike Bike L2 x  2mn   Knee/Hip Exercises: Machines for Strengthening   Total Gym Leg Press 65# 3x10 bil, 30# single leg 3x10 each seat # 4   Knee/Hip Exercises: Standing   Lateral Step Up Both;2 sets;10 reps;Hand Hold: 0;Step Height: 6"  with opposite leg into abduction   Forward Step Up Both;2 sets;10 reps;Hand Hold: 1;Step Height: 8"   Rocker Board 3 minutes   Rebounder weight shifting 3 ways x 1 minute each   Walking with Sports Cord 25# 4 waysx 10 each                  PT Short Term Goals - 04/16/16 1300    PT  SHORT TERM GOAL #2   Title walk for exercise for 30 minutes without limitation due to knee pain   Status Achieved           PT Long Term Goals - 04/23/16 1214    PT LONG TERM GOAL #3   Title walk for exercise for 45 minutes without limitation due to knee pain   Period Weeks   Status --  30 minutes   PT LONG TERM GOAL #4   Title reduce knee pain to < or  = to 3/10 with standing and walking tasks   Time 8   Period Weeks   Status On-going  Inconsistent   PT LONG TERM GOAL #5   Title reduce knee pain to < or = to 2/10 knee pain with standing and walking   Time 8   Period Weeks   Status On-going  Inconsistent               Plan - 04/23/16 1153    Clinical Impression Statement Last few days pt has experienced more posterior knee pain bil. She is unsure why, has done nothing different. She was able to complete her session today without increasing her knee pain. She  can walk 30 minutes now approaching her goal of 45 min.    Rehab Potential Good   PT Frequency 2x / week   PT Duration 8 weeks   PT Treatment/Interventions ADLs/Self Care Home Management;Cryotherapy;Electrical Stimulation;Moist Heat;Therapeutic exercise;Therapeutic activities;Functional mobility training;Stair training;Ultrasound;Neuromuscular re-education;Patient/family education;Manual techniques;Taping;Dry needling;Passive range of motion   PT Next Visit Plan Bil knee/hip strength and flexibility   Consulted and Agree with Plan of Care Patient      Patient will benefit from skilled therapeutic intervention in order to improve the following deficits and impairments:  Pain, Decreased strength, Decreased activity tolerance, Difficulty walking  Visit Diagnosis: Pain in right knee  Pain in left knee  Muscle weakness (generalized)     Problem List There are no active problems to display for this patient.   Brooklyn Alfredo, PTA 04/23/2016, 12:26 PM PHYSICAL THERAPY DISCHARGE SUMMARY  Visits from  Start of Care: 8  Current functional level related to goals / functional outcomes: See above.  Pt canceled all appointments and said she would return if needed.     Remaining deficits: See above for most current status.     Education / Equipment: HEP Plan: Patient agrees to discharge.  Patient goals were partially met. Patient is being discharged due to the patient's request.  ?????   Sigurd Sos, PT 05/15/2016 11:15 AM  [facility] [address], [place], [place], [address] Phone: [phone]   Fax:  [phone]  Name: [name] MRN: [id number] Date of Birth: [birth year]

## 2016-11-21 ENCOUNTER — Other Ambulatory Visit: Payer: Self-pay

## 2016-11-21 ENCOUNTER — Other Ambulatory Visit (HOSPITAL_COMMUNITY)
Admission: RE | Admit: 2016-11-21 | Discharge: 2016-11-21 | Disposition: A | Discharge status: Home / Self Care | Payer: Managed Care, Other (non HMO) | Source: Ambulatory Visit | Attending: Pediatrics | Admitting: Pediatrics

## 2016-11-21 DIAGNOSIS — Z01419 Encounter for gynecological examination (general) (routine) without abnormal findings: Secondary | ICD-10-CM | POA: Diagnosis not present

## 2016-11-26 LAB — CYTOLOGY - PAP: Diagnosis: NEGATIVE

## 2016-12-03 ENCOUNTER — Other Ambulatory Visit: Payer: Self-pay | Admitting: Family

## 2016-12-03 DIAGNOSIS — M7989 Other specified soft tissue disorders: Secondary | ICD-10-CM

## 2016-12-04 ENCOUNTER — Other Ambulatory Visit: Payer: Self-pay | Admitting: Family

## 2016-12-04 DIAGNOSIS — M7989 Other specified soft tissue disorders: Secondary | ICD-10-CM

## 2016-12-13 ENCOUNTER — Ambulatory Visit
Admission: RE | Admit: 2016-12-13 | Discharge: 2016-12-13 | Disposition: A | Discharge status: Home / Self Care | Payer: Managed Care, Other (non HMO) | Source: Ambulatory Visit | Attending: Family | Admitting: Family

## 2016-12-13 DIAGNOSIS — M7989 Other specified soft tissue disorders: Secondary | ICD-10-CM

## 2017-03-08 DIAGNOSIS — H00012 Hordeolum externum right lower eyelid: Secondary | ICD-10-CM | POA: Diagnosis not present

## 2017-03-18 DIAGNOSIS — H00012 Hordeolum externum right lower eyelid: Secondary | ICD-10-CM | POA: Diagnosis not present

## 2017-04-09 DIAGNOSIS — M2550 Pain in unspecified joint: Secondary | ICD-10-CM | POA: Diagnosis not present

## 2017-04-12 ENCOUNTER — Other Ambulatory Visit: Payer: Self-pay | Admitting: Family Medicine

## 2017-04-12 ENCOUNTER — Ambulatory Visit
Admission: RE | Admit: 2017-04-12 | Discharge: 2017-04-12 | Disposition: A | Discharge status: Home / Self Care | Payer: BLUE CROSS/BLUE SHIELD | Source: Ambulatory Visit | Attending: Family Medicine | Admitting: Family Medicine

## 2017-04-12 DIAGNOSIS — M79674 Pain in right toe(s): Secondary | ICD-10-CM | POA: Diagnosis not present

## 2017-04-12 DIAGNOSIS — M7989 Other specified soft tissue disorders: Secondary | ICD-10-CM | POA: Diagnosis not present

## 2017-04-12 DIAGNOSIS — R52 Pain, unspecified: Secondary | ICD-10-CM

## 2017-04-12 DIAGNOSIS — M79641 Pain in right hand: Secondary | ICD-10-CM | POA: Diagnosis not present

## 2017-04-16 DIAGNOSIS — M130 Polyarthritis, unspecified: Secondary | ICD-10-CM | POA: Diagnosis not present

## 2017-05-29 DIAGNOSIS — R768 Other specified abnormal immunological findings in serum: Secondary | ICD-10-CM | POA: Diagnosis not present

## 2017-07-12 DIAGNOSIS — E559 Vitamin D deficiency, unspecified: Secondary | ICD-10-CM | POA: Diagnosis not present

## 2017-07-12 DIAGNOSIS — M130 Polyarthritis, unspecified: Secondary | ICD-10-CM | POA: Diagnosis not present

## 2017-07-12 DIAGNOSIS — R21 Rash and other nonspecific skin eruption: Secondary | ICD-10-CM | POA: Diagnosis not present

## 2017-09-16 DIAGNOSIS — Z713 Dietary counseling and surveillance: Secondary | ICD-10-CM | POA: Diagnosis not present

## 2017-11-15 DIAGNOSIS — E559 Vitamin D deficiency, unspecified: Secondary | ICD-10-CM | POA: Diagnosis not present

## 2017-11-15 DIAGNOSIS — E538 Deficiency of other specified B group vitamins: Secondary | ICD-10-CM | POA: Diagnosis not present

## 2018-01-08 DIAGNOSIS — R768 Other specified abnormal immunological findings in serum: Secondary | ICD-10-CM | POA: Diagnosis not present

## 2018-01-08 DIAGNOSIS — L84 Corns and callosities: Secondary | ICD-10-CM | POA: Diagnosis not present

## 2018-01-10 DIAGNOSIS — R229 Localized swelling, mass and lump, unspecified: Secondary | ICD-10-CM | POA: Diagnosis not present

## 2018-01-10 DIAGNOSIS — M25511 Pain in right shoulder: Secondary | ICD-10-CM | POA: Diagnosis not present

## 2018-01-10 DIAGNOSIS — M549 Dorsalgia, unspecified: Secondary | ICD-10-CM | POA: Diagnosis not present

## 2018-02-18 DIAGNOSIS — E538 Deficiency of other specified B group vitamins: Secondary | ICD-10-CM | POA: Diagnosis not present

## 2018-03-06 DIAGNOSIS — R768 Other specified abnormal immunological findings in serum: Secondary | ICD-10-CM | POA: Diagnosis not present

## 2018-03-06 DIAGNOSIS — M79641 Pain in right hand: Secondary | ICD-10-CM | POA: Diagnosis not present

## 2018-03-06 DIAGNOSIS — Z6823 Body mass index (BMI) 23.0-23.9, adult: Secondary | ICD-10-CM | POA: Diagnosis not present

## 2018-05-16 DIAGNOSIS — Z0000 Encounter for general adult medical examination without abnormal findings: Secondary | ICD-10-CM | POA: Diagnosis not present

## 2018-05-16 DIAGNOSIS — E538 Deficiency of other specified B group vitamins: Secondary | ICD-10-CM | POA: Diagnosis not present

## 2018-05-16 DIAGNOSIS — E559 Vitamin D deficiency, unspecified: Secondary | ICD-10-CM | POA: Diagnosis not present

## 2018-07-16 DIAGNOSIS — R768 Other specified abnormal immunological findings in serum: Secondary | ICD-10-CM | POA: Diagnosis not present

## 2018-07-28 DIAGNOSIS — M79641 Pain in right hand: Secondary | ICD-10-CM | POA: Diagnosis not present

## 2018-07-28 DIAGNOSIS — M25562 Pain in left knee: Secondary | ICD-10-CM | POA: Diagnosis not present

## 2018-07-28 DIAGNOSIS — Z0001 Encounter for general adult medical examination with abnormal findings: Secondary | ICD-10-CM | POA: Diagnosis not present

## 2018-07-28 DIAGNOSIS — M25572 Pain in left ankle and joints of left foot: Secondary | ICD-10-CM | POA: Diagnosis not present

## 2018-11-06 DIAGNOSIS — M25562 Pain in left knee: Secondary | ICD-10-CM | POA: Diagnosis not present

## 2019-01-14 DIAGNOSIS — R768 Other specified abnormal immunological findings in serum: Secondary | ICD-10-CM | POA: Diagnosis not present

## 2019-01-14 DIAGNOSIS — Z6824 Body mass index (BMI) 24.0-24.9, adult: Secondary | ICD-10-CM | POA: Diagnosis not present

## 2019-04-09 ENCOUNTER — Ambulatory Visit (INDEPENDENT_AMBULATORY_CARE_PROVIDER_SITE_OTHER): Payer: BLUE CROSS/BLUE SHIELD

## 2019-04-09 ENCOUNTER — Other Ambulatory Visit: Payer: Self-pay

## 2019-04-09 ENCOUNTER — Encounter: Payer: Self-pay | Admitting: Podiatry

## 2019-04-09 ENCOUNTER — Ambulatory Visit (INDEPENDENT_AMBULATORY_CARE_PROVIDER_SITE_OTHER): Payer: BLUE CROSS/BLUE SHIELD | Admitting: Podiatry

## 2019-04-09 VITALS — BP 123/77 | HR 79 | Temp 97.3°F | Resp 16

## 2019-04-09 DIAGNOSIS — M7751 Other enthesopathy of right foot: Secondary | ICD-10-CM

## 2019-04-09 DIAGNOSIS — M2011 Hallux valgus (acquired), right foot: Secondary | ICD-10-CM

## 2019-04-09 DIAGNOSIS — M7752 Other enthesopathy of left foot: Secondary | ICD-10-CM

## 2019-04-09 DIAGNOSIS — M778 Other enthesopathies, not elsewhere classified: Secondary | ICD-10-CM

## 2019-04-09 DIAGNOSIS — M779 Enthesopathy, unspecified: Secondary | ICD-10-CM

## 2019-04-09 DIAGNOSIS — L84 Corns and callosities: Secondary | ICD-10-CM | POA: Diagnosis not present

## 2019-04-09 DIAGNOSIS — M216X1 Other acquired deformities of right foot: Secondary | ICD-10-CM

## 2019-04-09 MED ORDER — MELOXICAM 15 MG PO TABS: 15.0000 mg | ORAL_TABLET | Freq: Every day | ORAL | 0 refills | Status: AC

## 2019-04-09 NOTE — Progress Notes (Signed)
   Subjective:    Patient ID: [name], female    DOB: 11-Jan-1976, 43 y.o.   MRN: [id number]  HPI 43 year old female presents the office with concerns of right foot pain mostly on the first MPJ that she points to.  She has been doing some research online and she thinks that she has  "hallux rigidus". She states is been ongoing for the last 8 weeks and she feels pressure inside of her shoes.  She denies any recent injury or trauma.  She has had some swelling to the first MPJ but she denies any redness or warmth.  She also has a small area submetatarsal 5 is becoming a weird feeling but does not hurt.  This is been ongoing for 3 months.  She points to the area of the callus.  She has no other concerns today.   Review of Systems  All other systems reviewed and are negative.  History reviewed. No pertinent past medical history.  History reviewed. No pertinent surgical history.   Current Outpatient Medications:  .  Cyanocobalamin (VITAMIN B-12 PO), Take 1 tablet by mouth daily., Disp: , Rfl:  .  VITAMIN D PO, Take 1 tablet by mouth daily., Disp: , Rfl:  .  meloxicam (MOBIC) 15 MG tablet, Take 1 tablet (15 mg total) by mouth daily., Disp: 14 tablet, Rfl: 0  No Known Allergies      Objective:   Physical Exam  General: AAO x3, NAD  Dermatological: Minimal hyperkeratotic tissue right foot submetatarsal 5.  No open sores identified at this time.  Vascular: Dorsalis Pedis artery and Posterior Tibial artery pedal pulses are 2/4 bilateral with immedate capillary fill time.  There is no pain with calf compression, swelling, warmth, erythema.   Neruologic: Grossly intact via light touch bilateral. Vibratory intact via tuning fork bilateral. Protective threshold with Semmes Wienstein monofilament intact to all pedal sites bilateral. Patellar and Achilles deep tendon reflexes 2+ bilateral. No Babinski or clonus noted bilateral.   Musculoskeletal: Bony deformities present on the right foot worse  than left.  There is localized edema to the first MPJ.  There is tenderness at end range of motion dorsiflexion of the first MPJ.  There is no crepitation MPJ range of motion.  No first ray hypermobility.  No pain to the callus area submetatarsal 5.  No other areas of pinpoint tenderness.  Gait: Unassisted, Nonantalgic.      Assessment & Plan:   43 year old female with capsulitis right first MPJ, hallux limitus/bunion -Treatment options discussed including all alternatives, risks, and complications -Etiology of symptoms were discussed -X-rays were obtained and reviewed with the patient.  30 deformities present.  No evidence of acute fracture or stress fracture. -Prescribed mobic. Discussed side effects of the medication and directed to stop if any are to occur and call the office.  -Discussed steroid injection but she wishes to hold off today.  If symptoms continue she will consider next appointment. -Discussed shoe modifications and wearing a more rigid bottom shoe.  With discussed orthotics and she wants to proceed with inserts. Raiford Noble saw her today and she was measured for inserts.  We will offload the first MPJ. -Moisturizer for the callus area.   Return in about 3 weeks (around 04/30/2019).  Vivi Barrack DPM

## 2019-04-13 ENCOUNTER — Other Ambulatory Visit: Payer: Self-pay | Admitting: Podiatry

## 2019-04-13 DIAGNOSIS — M2011 Hallux valgus (acquired), right foot: Secondary | ICD-10-CM

## 2019-04-30 ENCOUNTER — Encounter: Payer: Self-pay | Admitting: Podiatry

## 2019-04-30 ENCOUNTER — Other Ambulatory Visit: Payer: Self-pay

## 2019-04-30 ENCOUNTER — Ambulatory Visit (INDEPENDENT_AMBULATORY_CARE_PROVIDER_SITE_OTHER): Payer: BLUE CROSS/BLUE SHIELD | Admitting: Podiatry

## 2019-04-30 DIAGNOSIS — M205X1 Other deformities of toe(s) (acquired), right foot: Secondary | ICD-10-CM

## 2019-04-30 DIAGNOSIS — M778 Other enthesopathies, not elsewhere classified: Secondary | ICD-10-CM

## 2019-04-30 DIAGNOSIS — M2011 Hallux valgus (acquired), right foot: Secondary | ICD-10-CM

## 2019-04-30 DIAGNOSIS — M7751 Other enthesopathy of right foot: Secondary | ICD-10-CM

## 2019-04-30 MED ORDER — TRIAMCINOLONE ACETONIDE 10 MG/ML IJ SUSP
10.0000 mg | Freq: Once | INTRAMUSCULAR | Status: AC
  Administered 2019-04-30: 15:00:00 10 mg

## 2019-04-30 NOTE — Patient Instructions (Signed)

## 2019-05-01 ENCOUNTER — Encounter: Payer: Self-pay | Admitting: Podiatry

## 2019-05-01 NOTE — Progress Notes (Signed)
Subjective: 43 year old female presents the office today to pick up orthotics with Raiford Noble however she states that she like to get an injection along the big toe joint the right side she states that overall is been feeling better but she was having some discomfort today still.  She had some occasional swelling.  She points the top of the first MPJ where she gets discomfort.  No recent injury or changes otherwise. Denies any systemic complaints such as fevers, chills, nausea, vomiting. No acute changes since last appointment, and no other complaints at this time.   Objective: AAO x3, NAD DP/PT pulses palpable bilaterally, CRT less than 3 seconds There is tenderness to palpation directly on the dorsal aspect of the first MPJ.  Mild discomfort the medial first MPJ.  No pain to the sesamoids.  Minimal edema there is no erythema or warmth.  No other areas of tenderness identified. No open lesions or pre-ulcerative lesions.  No pain with calf compression, swelling, warmth, erythema  Assessment: Capsulitis right first MPJ, hallux limitus  Plan: -All treatment options discussed with the patient including all alternatives, risks, complications.  -Steroid injection performed today.  See procedure note below. -Orthotics were dispensed today.  Oral and written break-in instructions were discussed. -She purchase new shoes which is been helpful as well. -Patient encouraged to call the office with any questions, concerns, change in symptoms.   Procedure: Injection small joint Discussed alternatives, risks, complications and verbal consent was obtained.  Location: Right 1st MTPJ Skin Prep: Betadine Injectate: 0.5cc 0.5% marcaine plain, 0.5 cc 2% lidocaine plain and, 1 cc kenalog 10. Disposition: Patient tolerated procedure well. Injection site dressed with a band-aid.  Post-injection care was discussed and return precautions discussed.   Vivi Barrack DPM

## 2019-05-12 ENCOUNTER — Other Ambulatory Visit: Payer: Self-pay | Admitting: Obstetrics and Gynecology

## 2019-05-12 ENCOUNTER — Other Ambulatory Visit (HOSPITAL_COMMUNITY)
Admission: RE | Admit: 2019-05-12 | Discharge: 2019-05-12 | Disposition: A | Discharge status: Home / Self Care | Payer: BLUE CROSS/BLUE SHIELD | Source: Ambulatory Visit | Attending: Obstetrics and Gynecology | Admitting: Obstetrics and Gynecology

## 2019-05-12 DIAGNOSIS — Z01419 Encounter for gynecological examination (general) (routine) without abnormal findings: Secondary | ICD-10-CM | POA: Diagnosis not present

## 2019-05-12 DIAGNOSIS — Z029 Encounter for administrative examinations, unspecified: Secondary | ICD-10-CM | POA: Insufficient documentation

## 2019-05-14 LAB — CYTOLOGY - PAP
Diagnosis: UNDETERMINED — AB
HPV: NOT DETECTED

## 2019-05-21 DIAGNOSIS — M0600 Rheumatoid arthritis without rheumatoid factor, unspecified site: Secondary | ICD-10-CM | POA: Diagnosis not present

## 2019-05-21 DIAGNOSIS — E559 Vitamin D deficiency, unspecified: Secondary | ICD-10-CM | POA: Diagnosis not present

## 2019-05-21 DIAGNOSIS — Z0000 Encounter for general adult medical examination without abnormal findings: Secondary | ICD-10-CM | POA: Diagnosis not present

## 2019-05-21 DIAGNOSIS — Z8249 Family history of ischemic heart disease and other diseases of the circulatory system: Secondary | ICD-10-CM | POA: Diagnosis not present

## 2019-05-21 DIAGNOSIS — E538 Deficiency of other specified B group vitamins: Secondary | ICD-10-CM | POA: Diagnosis not present

## 2019-05-21 DIAGNOSIS — Z13220 Encounter for screening for lipoid disorders: Secondary | ICD-10-CM | POA: Diagnosis not present

## 2019-05-28 ENCOUNTER — Ambulatory Visit: Payer: BLUE CROSS/BLUE SHIELD | Admitting: Podiatry

## 2019-07-27 DIAGNOSIS — M069 Rheumatoid arthritis, unspecified: Secondary | ICD-10-CM | POA: Diagnosis not present

## 2019-07-27 DIAGNOSIS — J301 Allergic rhinitis due to pollen: Secondary | ICD-10-CM | POA: Diagnosis not present

## 2019-08-12 DIAGNOSIS — M0579 Rheumatoid arthritis with rheumatoid factor of multiple sites without organ or systems involvement: Secondary | ICD-10-CM | POA: Diagnosis not present

## 2019-09-04 ENCOUNTER — Encounter (HOSPITAL_COMMUNITY): Payer: Self-pay | Admitting: Emergency Medicine

## 2019-09-04 ENCOUNTER — Emergency Department (HOSPITAL_COMMUNITY)
Admission: EM | Admit: 2019-09-04 | Discharge: 2019-09-04 | Disposition: A | Discharge status: Home / Self Care | Payer: BC Managed Care – PPO | Attending: Emergency Medicine | Admitting: Emergency Medicine

## 2019-09-04 ENCOUNTER — Emergency Department (HOSPITAL_COMMUNITY): Payer: BC Managed Care – PPO

## 2019-09-04 ENCOUNTER — Other Ambulatory Visit: Payer: Self-pay

## 2019-09-04 DIAGNOSIS — S299XXA Unspecified injury of thorax, initial encounter: Secondary | ICD-10-CM | POA: Diagnosis not present

## 2019-09-04 DIAGNOSIS — R0789 Other chest pain: Secondary | ICD-10-CM | POA: Insufficient documentation

## 2019-09-04 DIAGNOSIS — M25512 Pain in left shoulder: Secondary | ICD-10-CM | POA: Diagnosis not present

## 2019-09-04 DIAGNOSIS — Z79899 Other long term (current) drug therapy: Secondary | ICD-10-CM | POA: Insufficient documentation

## 2019-09-04 DIAGNOSIS — R21 Rash and other nonspecific skin eruption: Secondary | ICD-10-CM | POA: Diagnosis not present

## 2019-09-04 DIAGNOSIS — S99922A Unspecified injury of left foot, initial encounter: Secondary | ICD-10-CM | POA: Diagnosis not present

## 2019-09-04 DIAGNOSIS — M79672 Pain in left foot: Secondary | ICD-10-CM | POA: Diagnosis not present

## 2019-09-04 DIAGNOSIS — M25562 Pain in left knee: Secondary | ICD-10-CM | POA: Diagnosis not present

## 2019-09-04 DIAGNOSIS — Y92410 Unspecified street and highway as the place of occurrence of the external cause: Secondary | ICD-10-CM | POA: Insufficient documentation

## 2019-09-04 DIAGNOSIS — Y999 Unspecified external cause status: Secondary | ICD-10-CM | POA: Diagnosis not present

## 2019-09-04 DIAGNOSIS — Y9389 Activity, other specified: Secondary | ICD-10-CM | POA: Insufficient documentation

## 2019-09-04 DIAGNOSIS — R52 Pain, unspecified: Secondary | ICD-10-CM | POA: Diagnosis not present
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
## 2019-09-04 NOTE — Discharge Instructions (Signed)
Take Tylenol and Ibuprofen as needed for pain.  Return for new or worsening symptoms
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
## 2019-10-02 DIAGNOSIS — M25462 Effusion, left knee: Secondary | ICD-10-CM | POA: Diagnosis not present

## 2019-10-23 DIAGNOSIS — M25562 Pain in left knee: Secondary | ICD-10-CM | POA: Diagnosis not present

## 2019-12-23 DIAGNOSIS — M05761 Rheumatoid arthritis with rheumatoid factor of right knee without organ or systems involvement: Secondary | ICD-10-CM | POA: Diagnosis not present

## 2019-12-24 DIAGNOSIS — M25461 Effusion, right knee: Secondary | ICD-10-CM | POA: Diagnosis not present

## 2019-12-24 DIAGNOSIS — R768 Other specified abnormal immunological findings in serum: Secondary | ICD-10-CM | POA: Diagnosis not present

## 2019-12-24 DIAGNOSIS — M0579 Rheumatoid arthritis with rheumatoid factor of multiple sites without organ or systems involvement: Secondary | ICD-10-CM | POA: Diagnosis not present

## 2020-01-22 DIAGNOSIS — E538 Deficiency of other specified B group vitamins: Secondary | ICD-10-CM | POA: Diagnosis not present

## 2020-01-22 DIAGNOSIS — E559 Vitamin D deficiency, unspecified: Secondary | ICD-10-CM | POA: Diagnosis not present

## 2020-04-07 DIAGNOSIS — Z13220 Encounter for screening for lipoid disorders: Secondary | ICD-10-CM | POA: Diagnosis not present

## 2020-04-07 DIAGNOSIS — R5382 Chronic fatigue, unspecified: Secondary | ICD-10-CM | POA: Diagnosis not present

## 2020-04-07 DIAGNOSIS — M0579 Rheumatoid arthritis with rheumatoid factor of multiple sites without organ or systems involvement: Secondary | ICD-10-CM | POA: Diagnosis not present

## 2020-04-07 DIAGNOSIS — Z1321 Encounter for screening for nutritional disorder: Secondary | ICD-10-CM | POA: Diagnosis not present

## 2020-04-07 DIAGNOSIS — R768 Other specified abnormal immunological findings in serum: Secondary | ICD-10-CM | POA: Diagnosis not present

## 2020-04-07 DIAGNOSIS — Z131 Encounter for screening for diabetes mellitus: Secondary | ICD-10-CM | POA: Diagnosis not present

## 2020-06-03 DIAGNOSIS — M130 Polyarthritis, unspecified: Secondary | ICD-10-CM | POA: Diagnosis not present

## 2020-06-03 DIAGNOSIS — Z0000 Encounter for general adult medical examination without abnormal findings: Secondary | ICD-10-CM | POA: Diagnosis not present

## 2020-06-03 DIAGNOSIS — E559 Vitamin D deficiency, unspecified: Secondary | ICD-10-CM | POA: Diagnosis not present

## 2020-09-13 DIAGNOSIS — Z01419 Encounter for gynecological examination (general) (routine) without abnormal findings: Secondary | ICD-10-CM | POA: Diagnosis not present

## 2020-11-23 DIAGNOSIS — E559 Vitamin D deficiency, unspecified: Secondary | ICD-10-CM | POA: Diagnosis not present

## 2020-11-23 DIAGNOSIS — R7401 Elevation of levels of liver transaminase levels: Secondary | ICD-10-CM | POA: Diagnosis not present

## 2021-03-20 DIAGNOSIS — M25461 Effusion, right knee: Secondary | ICD-10-CM | POA: Diagnosis not present

## 2021-03-20 DIAGNOSIS — Z1321 Encounter for screening for nutritional disorder: Secondary | ICD-10-CM | POA: Diagnosis not present

## 2021-03-20 DIAGNOSIS — M0579 Rheumatoid arthritis with rheumatoid factor of multiple sites without organ or systems involvement: Secondary | ICD-10-CM | POA: Diagnosis not present

## 2021-03-20 DIAGNOSIS — R768 Other specified abnormal immunological findings in serum: Secondary | ICD-10-CM | POA: Diagnosis not present

## 2021-07-05 DIAGNOSIS — Z0000 Encounter for general adult medical examination without abnormal findings: Secondary | ICD-10-CM | POA: Diagnosis not present

## 2021-07-05 DIAGNOSIS — E559 Vitamin D deficiency, unspecified: Secondary | ICD-10-CM | POA: Diagnosis not present

## 2021-07-05 DIAGNOSIS — Z131 Encounter for screening for diabetes mellitus: Secondary | ICD-10-CM | POA: Diagnosis not present

## 2021-07-05 DIAGNOSIS — Z13220 Encounter for screening for lipoid disorders: Secondary | ICD-10-CM | POA: Diagnosis not present

## 2021-07-05 DIAGNOSIS — E538 Deficiency of other specified B group vitamins: Secondary | ICD-10-CM | POA: Diagnosis not present

## 2022-03-20 DIAGNOSIS — R768 Other specified abnormal immunological findings in serum: Secondary | ICD-10-CM | POA: Diagnosis not present

## 2022-03-20 DIAGNOSIS — M79641 Pain in right hand: Secondary | ICD-10-CM | POA: Diagnosis not present

## 2022-04-25 ENCOUNTER — Other Ambulatory Visit (HOSPITAL_BASED_OUTPATIENT_CLINIC_OR_DEPARTMENT_OTHER): Payer: Self-pay | Admitting: Family Medicine

## 2022-04-25 DIAGNOSIS — Z0289 Encounter for other administrative examinations: Secondary | ICD-10-CM

## 2022-04-27 ENCOUNTER — Ambulatory Visit (HOSPITAL_BASED_OUTPATIENT_CLINIC_OR_DEPARTMENT_OTHER)
Admission: RE | Admit: 2022-04-27 | Discharge: 2022-04-27 | Disposition: A | Discharge status: Home / Self Care | Payer: BC Managed Care – PPO | Source: Ambulatory Visit | Attending: Family Medicine | Admitting: Family Medicine

## 2022-04-27 DIAGNOSIS — Z1231 Encounter for screening mammogram for malignant neoplasm of breast: Secondary | ICD-10-CM | POA: Insufficient documentation

## 2022-04-27 DIAGNOSIS — Z0289 Encounter for other administrative examinations: Secondary | ICD-10-CM | POA: Diagnosis not present

## 2022-07-20 DIAGNOSIS — Z23 Encounter for immunization: Secondary | ICD-10-CM | POA: Diagnosis not present

## 2022-07-20 DIAGNOSIS — E559 Vitamin D deficiency, unspecified: Secondary | ICD-10-CM | POA: Diagnosis not present

## 2022-07-20 DIAGNOSIS — Z13220 Encounter for screening for lipoid disorders: Secondary | ICD-10-CM | POA: Diagnosis not present

## 2022-07-20 DIAGNOSIS — E538 Deficiency of other specified B group vitamins: Secondary | ICD-10-CM | POA: Diagnosis not present

## 2022-07-20 DIAGNOSIS — R7309 Other abnormal glucose: Secondary | ICD-10-CM | POA: Diagnosis not present

## 2022-07-20 DIAGNOSIS — Z0000 Encounter for general adult medical examination without abnormal findings: Secondary | ICD-10-CM | POA: Diagnosis not present

## 2022-07-23 DIAGNOSIS — Z0000 Encounter for general adult medical examination without abnormal findings: Secondary | ICD-10-CM | POA: Diagnosis not present

## 2024-02-06 IMAGING — MG MM DIGITAL SCREENING BILAT W/ TOMO AND CAD
8 series · 9 of 24 positions shown · non-contrast
Comparison: Previous exam(s).

CLINICAL DATA: Screening.

EXAM:
DIGITAL SCREENING BILATERAL MAMMOGRAM WITH TOMOSYNTHESIS AND CAD
TECHNIQUE: Bilateral screening digital craniocaudal and mediolateral oblique
mammograms were obtained. Bilateral screening digital breast
tomosynthesis was performed. The images were evaluated with
computer-aided detection.

[L CC synth-2D]
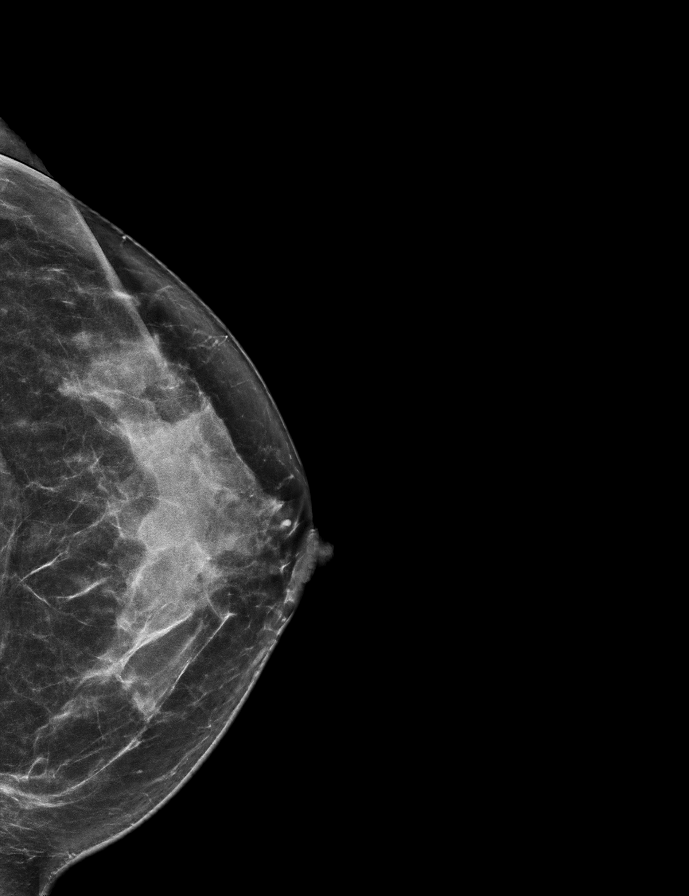

[R CC synth-2D]
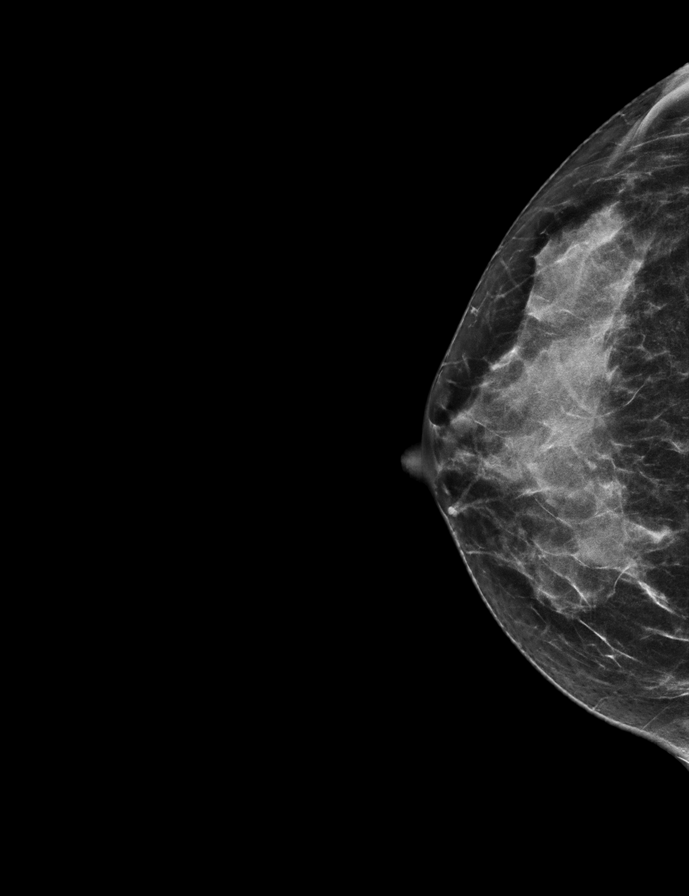

[L MLO synth-2D]
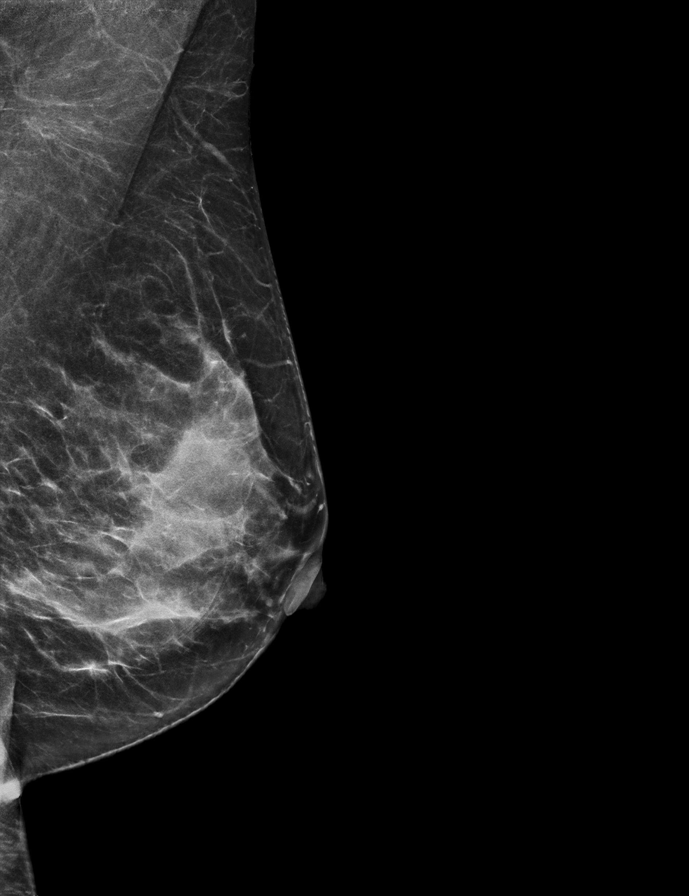

[R MLO synth-2D]
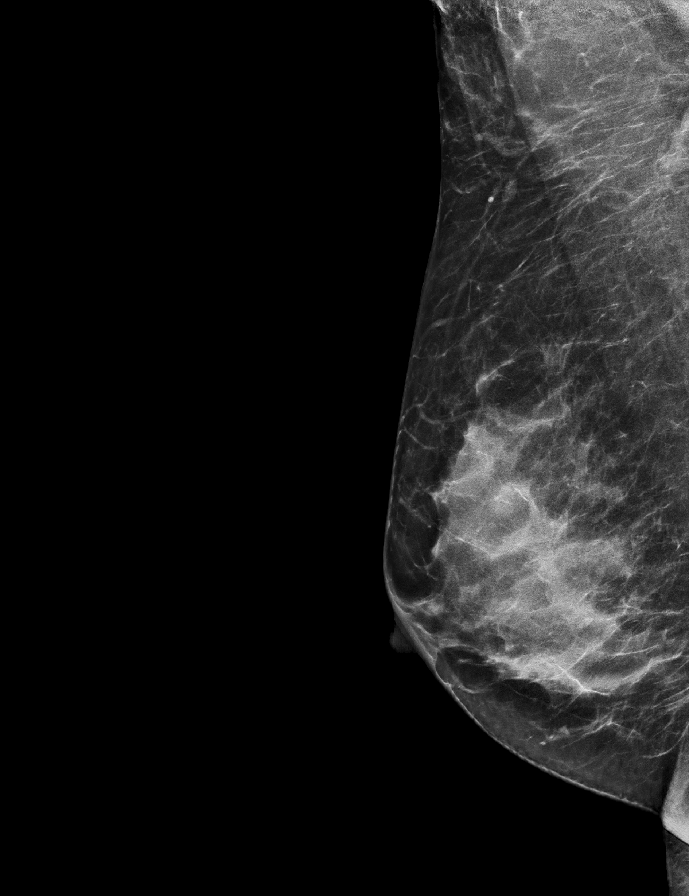

[R CC tomo · 2 of 53 frames shown]
[frame 18/53]
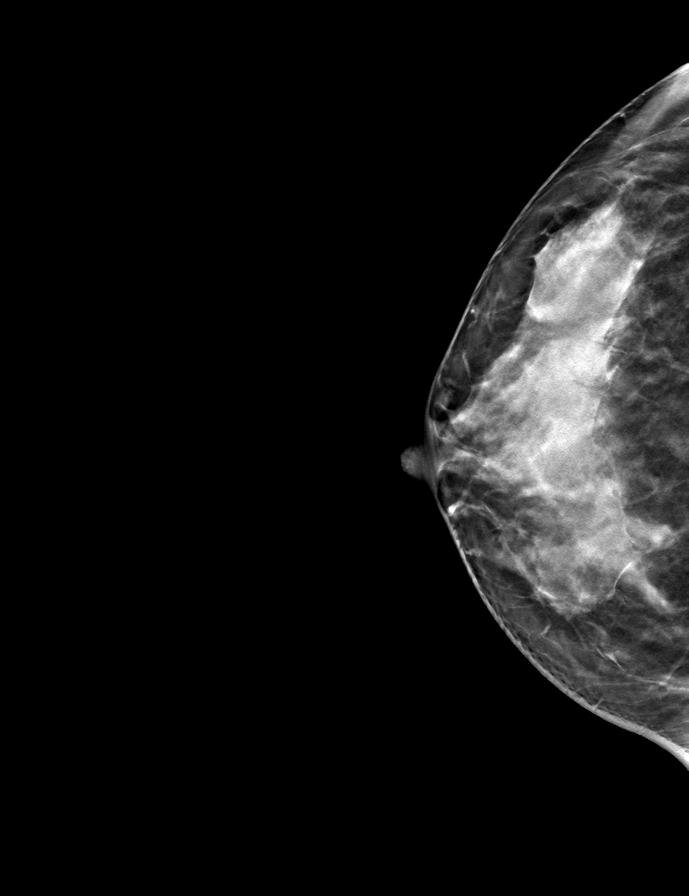
[frame 27/53]
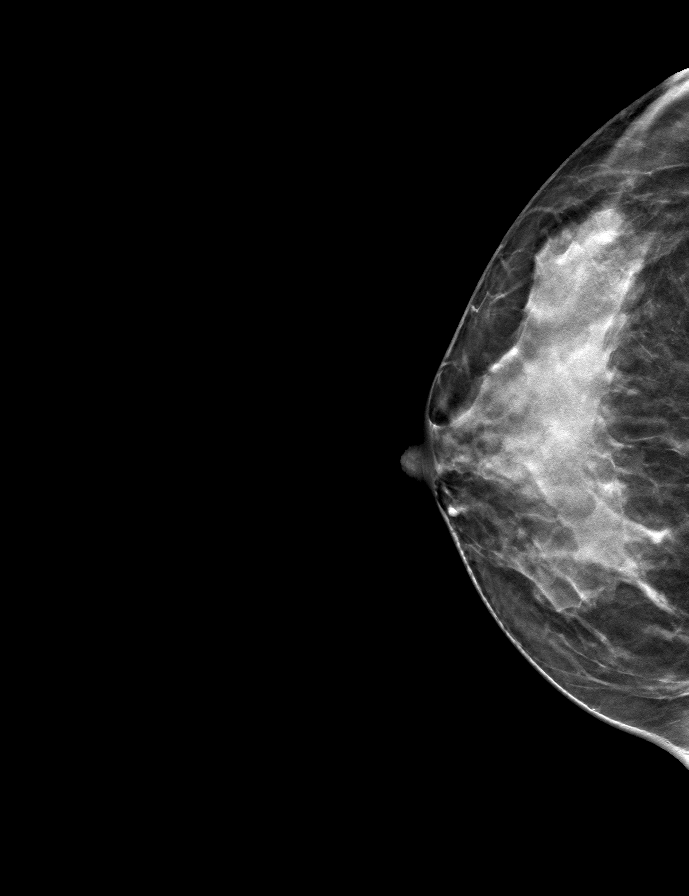

[L CC tomo · tomo slice 31/62.0]
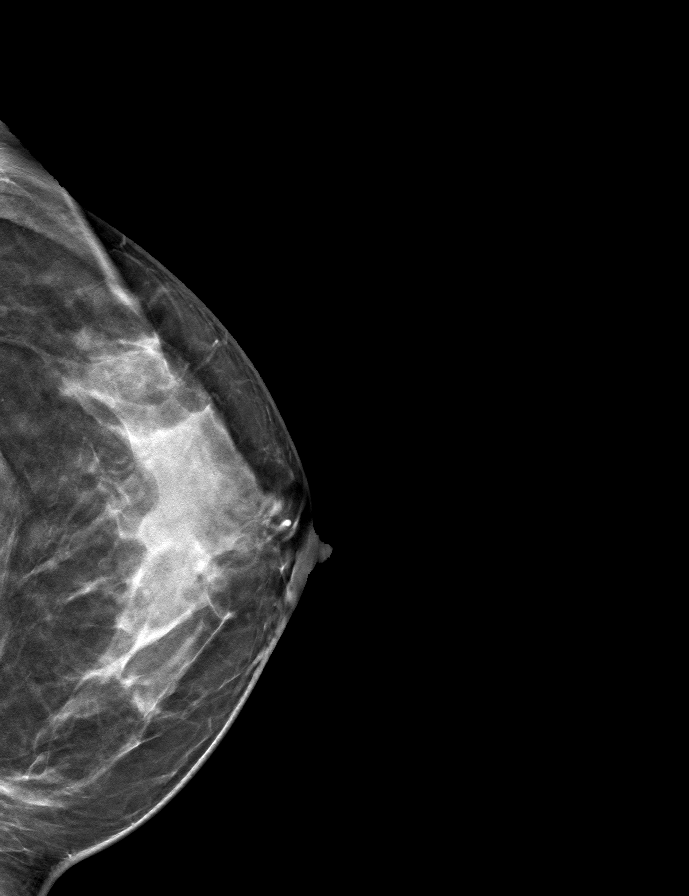

[R MLO tomo · tomo slice 31/60.0]
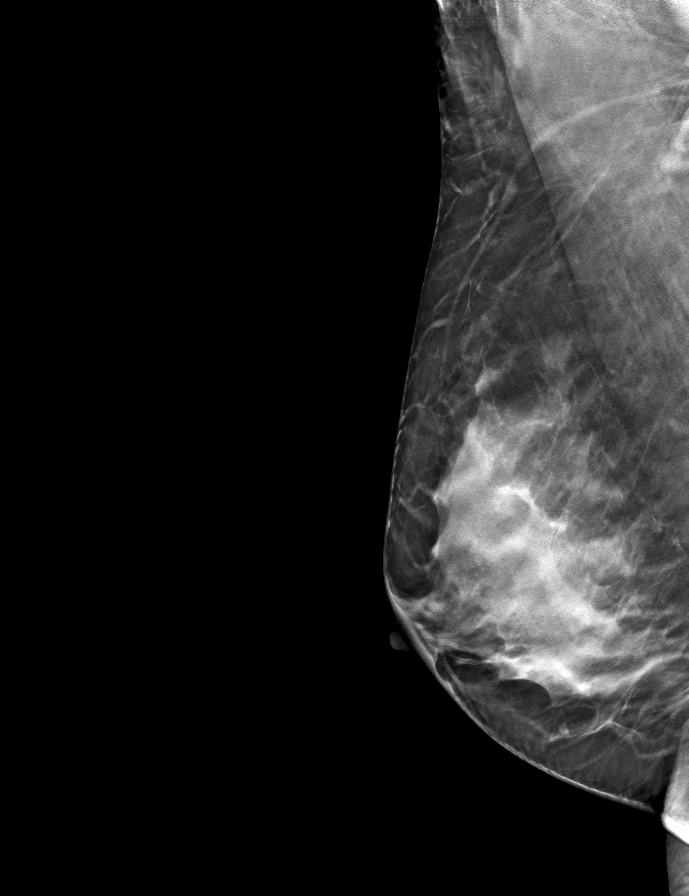

[L MLO tomo · tomo slice 30/59.0]
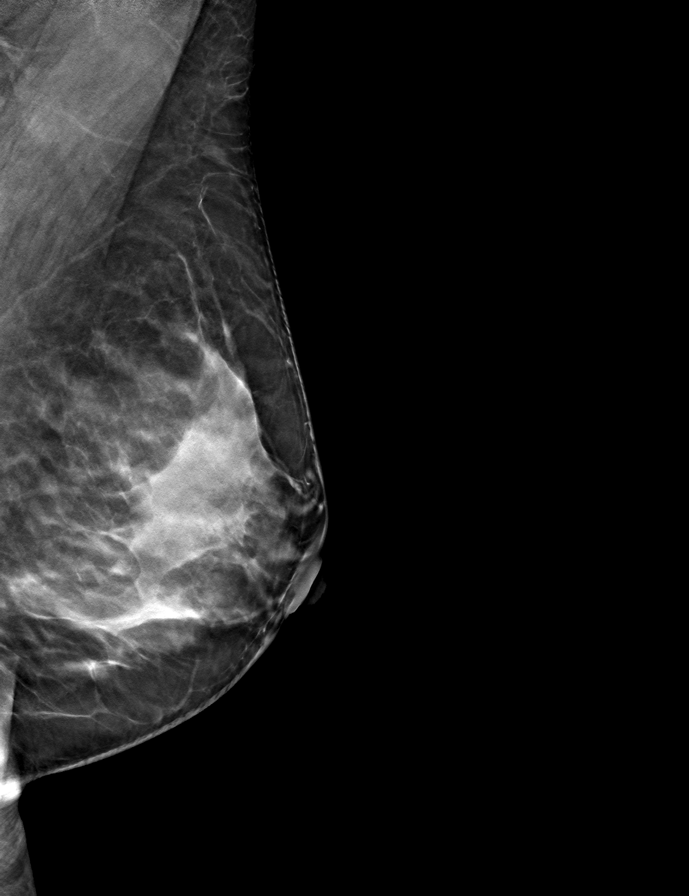

[9 of 24 positions shown; findings below may reference images not displayed]

ACR Breast Density Category d: The breast tissue is extremely dense,
which lowers the sensitivity of mammography
FINDINGS: There are no findings suspicious for malignancy.
IMPRESSION: No mammographic evidence of malignancy. A result letter of this
screening mammogram will be mailed directly to the patient.

RECOMMENDATION:
Screening mammogram in one year. (Code:TA-V-WV9)

BI-RADS CATEGORY  1: Negative.
